# Patient Record
Sex: Male | Born: 1995 | Race: Black or African American | Hispanic: No | Marital: Single | Smoking: Current some day smoker
Health system: Southern US, Community
[De-identification: ages and names within clinical notes are randomized; demographics above are authoritative.]

## PROBLEM LIST (undated history)

## (undated) HISTORY — PX: NO PAST SURGERIES: SHX2092

---
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
## 2021-12-31 ENCOUNTER — Ambulatory Visit (INDEPENDENT_AMBULATORY_CARE_PROVIDER_SITE_OTHER): Payer: BC Managed Care – PPO

## 2021-12-31 ENCOUNTER — Ambulatory Visit
Admission: RE | Admit: 2021-12-31 | Discharge: 2021-12-31 | Disposition: A | Discharge status: Home / Self Care | Payer: BC Managed Care – PPO | Source: Ambulatory Visit | Attending: Emergency Medicine | Admitting: Emergency Medicine

## 2021-12-31 VITALS — BP 126/83 | HR 78 | Temp 98.5°F | Resp 16

## 2021-12-31 DIAGNOSIS — M25511 Pain in right shoulder: Secondary | ICD-10-CM | POA: Diagnosis not present

## 2021-12-31 DIAGNOSIS — S46812A Strain of other muscles, fascia and tendons at shoulder and upper arm level, left arm, initial encounter: Secondary | ICD-10-CM

## 2021-12-31 DIAGNOSIS — M25512 Pain in left shoulder: Secondary | ICD-10-CM | POA: Diagnosis not present

## 2021-12-31 MED ORDER — IBUPROFEN 800 MG PO TABS
800.0000 mg | ORAL_TABLET | Freq: Once | ORAL | Status: AC
  Administered 2021-12-31: 800 mg via ORAL

## 2021-12-31 MED ORDER — IBUPROFEN 800 MG PO TABS: 800.0000 mg | ORAL_TABLET | Freq: Three times a day (TID) | ORAL | 0 refills | Status: AC | PRN

## 2021-12-31 MED ORDER — BACLOFEN 10 MG PO TABS: 10.0000 mg | ORAL_TABLET | Freq: Every day | ORAL | 0 refills | Status: AC

## 2021-12-31 NOTE — Discharge Instructions (Addendum)
The x-ray of your shoulder did not reveal any acute bony injury or trauma.  I believe that you have sprained one of the tendons of your rotator cuff, possibly the supraspinatus tendon.  I have enclosed some information about shoulder pain that I hope you find helpful.  Please keep your left arm in the sling we provided for you for the next 24 to 48 hours.    Please also continue taking ibuprofen 800 mg every 8 hours as needed for pain.  Your next dose will be due around 7 PM this evening.  I provided you with a prescription for a muscle relaxer called baclofen that I would like for you to take 1 hour before bedtime to reduce tension in the muscle which will also help ease your pain.  Please continue applying ice to your shoulder 3-4 times daily for 20 minutes each time to reduce inflammation and pain.  If you are not feeling any better in the next 24 to 48 hours and certainly if you are feeling worse, I recommend that you go to [facility] urgent [facility].  I enclosed their contact information for you.  They do not schedule appointments at their [facility] clinic but you can certainly call ahead if you would like.  Thank you for visiting urgent care today.

## 2021-12-31 NOTE — ED Triage Notes (Signed)
The patient c/o left sided shoulder pain, he states he fell on the extremity last night.   Home interventions: none

## 2021-12-31 NOTE — ED Provider Notes (Signed)
[facility]    CSN: [id number] Arrival date & time: 12/31/21  7829    HISTORY   Chief Complaint  Patient presents with   Shoulder Pain    I can not lift my arm due to shoulder pain - Entered by patient   HPI Robert Clements is a pleasant, 26 y.o. male who presents to urgent care today. Patient states he felt last night and landed on his left shoulder.  Patient states he is now having left shoulder pain.  Patient states the pain is not new, states swelling made it worse.  Patient states he has not had acute injury to his shoulder in the past, states he has had a slow onset of chronic left shoulder pain.  Patient states he has not had his chronic left shoulder pain evaluated as of yet.  Patient reports loss of range of motion secondary to pain.  Patient has normal vital signs on arrival today.  The history is provided by the patient.   History reviewed. No pertinent past medical history. There are no problems to display for this patient.  Past Surgical History:  Procedure Laterality Date   NO PAST SURGERIES      Home Medications    Prior to Admission medications   Not on File    Family History History reviewed. No pertinent family history. Social History Social History   Tobacco Use   Smoking status: Some Days    Types: Cigars   Smokeless tobacco: Never  Vaping Use   Vaping Use: Never used  Substance Use Topics   Alcohol use: Yes   Drug use: No   Allergies   Patient has no known allergies.  Review of Systems Review of Systems Pertinent findings revealed after performing a 14 point review of systems has been noted in the history of present illness.  Physical Exam Triage Vital Signs ED Triage Vitals  Enc Vitals Group     BP 12/11/20 0827 (!) 147/82     Pulse Rate 12/11/20 0827 72     Resp 12/11/20 0827 18     Temp 12/11/20 0827 98.3 F (36.8 C)     Temp Source 12/11/20 0827 Oral     SpO2 12/11/20 0827 98 %     Weight --      Height --       Head Circumference --      Peak Flow --      Pain Score 12/11/20 0826 5     Pain Loc --      Pain Edu? --      Excl. in GC? --    Updated Vital Signs BP 126/83 (BP Location: Right Arm)   Pulse 78   Temp 98.5 F (36.9 C) (Oral)   Resp 16   SpO2 94%   Physical Exam Vitals and nursing note reviewed.  Constitutional:      General: He is not in acute distress.    Appearance: Normal appearance. He is normal weight. He is not ill-appearing.  HENT:     Head: Normocephalic and atraumatic.  Eyes:     Extraocular Movements: Extraocular movements intact.     Conjunctiva/sclera: Conjunctivae normal.     Pupils: Pupils are equal, round, and reactive to light.  Cardiovascular:     Rate and Rhythm: Normal rate and regular rhythm.  Pulmonary:     Effort: Pulmonary effort is normal.     Breath sounds: Normal breath sounds.  Musculoskeletal:     Right  shoulder: Normal.     Left shoulder: Swelling and tenderness present. No deformity, effusion, laceration, bony tenderness or crepitus. Decreased range of motion. Decreased strength. Normal pulse.     Cervical back: Normal range of motion and neck supple.  Skin:    General: Skin is warm and dry.  Neurological:     General: No focal deficit present.     Mental Status: He is alert and oriented to person, place, and time. Mental status is at baseline.  Psychiatric:        Mood and Affect: Mood normal.        Behavior: Behavior normal.        Thought Content: Thought content normal.        Judgment: Judgment normal.     [facility] / Diagnostics / Procedures:     Radiology DG Shoulder Right  Result Date: 12/31/2021 CLINICAL DATA:  Shoulder pain.  Loss of range of motion. EXAM: RIGHT SHOULDER - 2+ VIEW COMPARISON:  None Available. FINDINGS: The glenohumeral and acromioclavicular joints are appropriately aligned. Joint spaces are preserved. No acute fracture or dislocation. The visualized portion of the left lung is unremarkable. IMPRESSION:  Normal left shoulder radiographs. Electronically Signed   By: Neita Garnet M.D.   On: 12/31/2021 10:40    Procedures Procedures (including critical care time) EKG  Pending results:  Labs Reviewed - No data to display  Medications Ordered in UC: Medications  ibuprofen (ADVIL) tablet 800 mg (has no administration in time range)    UC Diagnoses / Final Clinical Impressions(s)   I have reviewed the triage vital signs and the nursing notes.  Pertinent labs & imaging results that were available during my care of the patient were reviewed by me and considered in my medical decision making (see chart for details).    Final diagnoses:  Supraspinatus sprain, left, initial encounter  Acute pain of left shoulder   Patient was provided with ibuprofen 800 mg during the visit today as well as a prescription to take every 8 hours.  Patient also provided with baclofen to reduce muscle tension and ease pain.  Patient advised to use ice 4 times a day for 20 minutes each time.  Patient provided with a shoulder sling to wear for the next 24 to 48 hours.  Patient advised to go to [facility] urgent care walk-in clinic if not feeling any better or feeling worse in the next 24 to 48 hours.  ED Prescriptions     Medication Sig Dispense Auth. Provider   ibuprofen (ADVIL) 800 MG tablet Take 1 tablet (800 mg total) by mouth every 8 (eight) hours as needed for up to 10 days for fever, headache, mild pain or moderate pain. 30 tablet Theadora Rama Scales, PA-C   baclofen (LIORESAL) 10 MG tablet Take 1 tablet (10 mg total) by mouth at bedtime for 7 days. 7 tablet Theadora Rama Scales, PA-C      PDMP not reviewed this encounter.  Discharge Instructions:   Discharge Instructions      The x-ray of your shoulder did not reveal any acute bony injury or trauma.  I believe that you have sprained one of the tendons of your rotator cuff, possibly the supraspinatus tendon.  I have enclosed some  information about shoulder pain that I hope you find helpful.  Please keep your left arm in the sling we provided for you for the next 24 to 48 hours.    Please also continue taking ibuprofen 800  mg every 8 hours as needed for pain.  Your next dose will be due around 7 PM this evening.  I provided you with a prescription for a muscle relaxer called baclofen that I would like for you to take 1 hour before bedtime to reduce tension in the muscle which will also help ease your pain.  Please continue applying ice to your shoulder 3-4 times daily for 20 minutes each time to reduce inflammation and pain.  If you are not feeling any better in the next 24 to 48 hours and certainly if you are feeling worse, I recommend that you go to [facility]opedic urgent [facility].  I enclosed their contact information for you.  They do not schedule appointments at their [facility] clinic but you can certainly call ahead if you would like.  Thank you for visiting urgent care today.      Disposition Upon Discharge:  Condition: stable for discharge home Home: take medications as prescribed; routine discharge instructions as discussed; follow up as advised.  Patient presented with an acute illness with associated systemic symptoms and significant discomfort requiring urgent management. In my opinion, this is a condition that a prudent lay person (someone who possesses an average knowledge of health and medicine) may potentially expect to result in complications if not addressed urgently such as respiratory distress, impairment of bodily function or dysfunction of bodily organs.   Routine symptom specific, illness specific and/or disease specific instructions were discussed with the patient and/or caregiver at length.   As such, the patient has been evaluated and assessed, work-up was performed and treatment was provided in alignment with urgent care protocols and evidence based medicine.   Patient/parent/caregiver has been advised that the patient may require follow up for further testing and treatment if the symptoms continue in spite of treatment, as clinically indicated and appropriate.  Patient/parent/caregiver has been advised to report to orthopedic urgent care clinic or return to the [facility] or PCP in 3-5 days if no better; follow-up with orthopedics, PCP or the Emergency Department if new signs and symptoms develop or if the current signs or symptoms continue to change or worsen for further workup, evaluation and treatment as clinically indicated and appropriate  The patient will follow up with their current PCP if and as advised. If the patient does not currently have a PCP we will have assisted them in obtaining one.   The patient may need specialty follow up if the symptoms continue, in spite of conservative treatment and management, for further workup, evaluation, consultation and treatment as clinically indicated and appropriate.  Patient/parent/caregiver verbalized understanding and agreement of plan as discussed.  All questions were addressed during visit.  Please see discharge instructions below for further details of plan.  This office note has been dictated using Teaching laboratory technician.  Unfortunately, this method of dictation can sometimes lead to typographical or grammatical errors.  I apologize for your inconvenience in advance if this occurs.  Please do not hesitate to reach out to me if clarification is needed.      Theadora Rama Scales, PA-C 12/31/21 1103
